# Patient Record
Sex: Female | Born: 1970 | Race: Asian | Hispanic: No | Marital: Single | Smoking: Never smoker
Health system: Southern US, Community
[De-identification: ages and names within clinical notes are randomized; demographics above are authoritative.]

---

## 2013-02-02 HISTORY — PX: ABDOMINOPLASTY: SUR9

## 2016-10-13 ENCOUNTER — Other Ambulatory Visit: Payer: Self-pay | Admitting: Family Medicine

## 2016-10-13 DIAGNOSIS — R5381 Other malaise: Secondary | ICD-10-CM

## 2016-10-22 ENCOUNTER — Other Ambulatory Visit: Payer: Self-pay | Admitting: Family Medicine

## 2016-10-22 DIAGNOSIS — N6489 Other specified disorders of breast: Secondary | ICD-10-CM

## 2016-10-22 DIAGNOSIS — R928 Other abnormal and inconclusive findings on diagnostic imaging of breast: Secondary | ICD-10-CM

## 2016-10-30 ENCOUNTER — Inpatient Hospital Stay: Admission: RE | Admit: 2016-10-30 | Payer: Self-pay | Source: Ambulatory Visit

## 2016-10-30 ENCOUNTER — Other Ambulatory Visit: Payer: Self-pay

## 2016-11-04 ENCOUNTER — Ambulatory Visit
Admission: RE | Admit: 2016-11-04 | Discharge: 2016-11-04 | Disposition: A | Discharge status: Home / Self Care | Payer: BLUE CROSS/BLUE SHIELD | Source: Ambulatory Visit | Attending: Family Medicine | Admitting: Family Medicine

## 2016-11-04 ENCOUNTER — Ambulatory Visit: Payer: Self-pay

## 2016-11-04 DIAGNOSIS — N6489 Other specified disorders of breast: Secondary | ICD-10-CM

## 2016-11-04 DIAGNOSIS — R928 Other abnormal and inconclusive findings on diagnostic imaging of breast: Secondary | ICD-10-CM

## 2016-12-22 ENCOUNTER — Other Ambulatory Visit: Payer: Self-pay | Admitting: Obstetrics and Gynecology

## 2016-12-22 ENCOUNTER — Other Ambulatory Visit (HOSPITAL_COMMUNITY)
Admission: RE | Admit: 2016-12-22 | Discharge: 2016-12-22 | Disposition: A | Discharge status: Home / Self Care | Payer: BLUE CROSS/BLUE SHIELD | Source: Ambulatory Visit | Attending: Obstetrics and Gynecology | Admitting: Obstetrics and Gynecology

## 2016-12-22 DIAGNOSIS — Z124 Encounter for screening for malignant neoplasm of cervix: Secondary | ICD-10-CM | POA: Diagnosis not present

## 2016-12-28 LAB — CYTOLOGY - PAP
Chlamydia: NEGATIVE
Diagnosis: NEGATIVE
HPV: NOT DETECTED
Neisseria Gonorrhea: NEGATIVE

## 2019-05-09 ENCOUNTER — Other Ambulatory Visit: Payer: Self-pay | Admitting: Family Medicine

## 2019-05-09 DIAGNOSIS — Z1231 Encounter for screening mammogram for malignant neoplasm of breast: Secondary | ICD-10-CM

## 2019-05-12 ENCOUNTER — Ambulatory Visit
Admission: RE | Admit: 2019-05-12 | Discharge: 2019-05-12 | Disposition: A | Discharge status: Home / Self Care | Payer: 59 | Source: Ambulatory Visit | Attending: Family Medicine | Admitting: Family Medicine

## 2019-05-12 ENCOUNTER — Other Ambulatory Visit: Payer: Self-pay

## 2019-05-12 DIAGNOSIS — Z1231 Encounter for screening mammogram for malignant neoplasm of breast: Secondary | ICD-10-CM

## 2020-04-19 ENCOUNTER — Other Ambulatory Visit: Payer: Self-pay | Admitting: Family Medicine

## 2020-04-19 DIAGNOSIS — Z1231 Encounter for screening mammogram for malignant neoplasm of breast: Secondary | ICD-10-CM

## 2020-06-12 ENCOUNTER — Ambulatory Visit: Payer: 59

## 2020-08-12 ENCOUNTER — Other Ambulatory Visit: Payer: Self-pay

## 2020-08-12 ENCOUNTER — Ambulatory Visit
Admission: RE | Admit: 2020-08-12 | Discharge: 2020-08-12 | Disposition: A | Discharge status: Home / Self Care | Payer: 59 | Source: Ambulatory Visit | Attending: Family Medicine | Admitting: Family Medicine

## 2020-08-12 DIAGNOSIS — Z1231 Encounter for screening mammogram for malignant neoplasm of breast: Secondary | ICD-10-CM

## 2020-09-23 ENCOUNTER — Telehealth: Payer: Self-pay | Admitting: Physician Assistant

## 2020-09-23 NOTE — Telephone Encounter (Signed)
Received a new hem referralf rom Dr. Chanetta Marshall for IDA. Ms. Salsberry returned my call and has been scheduled to see Karena Addison on 9/6 at 2pm per pt's request.  Pt aware to arrive 20 minutes early.

## 2020-10-08 ENCOUNTER — Inpatient Hospital Stay: Payer: 59

## 2020-10-08 ENCOUNTER — Other Ambulatory Visit: Payer: Self-pay

## 2020-10-08 ENCOUNTER — Inpatient Hospital Stay: Payer: 59 | Attending: Physician Assistant | Admitting: Physician Assistant

## 2020-10-08 ENCOUNTER — Encounter: Payer: Self-pay | Admitting: Physician Assistant

## 2020-10-08 VITALS — BP 116/78 | HR 95 | Temp 99.0°F | Resp 18 | Wt 159.9 lb

## 2020-10-08 DIAGNOSIS — D509 Iron deficiency anemia, unspecified: Secondary | ICD-10-CM | POA: Insufficient documentation

## 2020-10-08 DIAGNOSIS — D508 Other iron deficiency anemias: Secondary | ICD-10-CM

## 2020-10-08 LAB — CBC WITH DIFFERENTIAL (CANCER CENTER ONLY)
Abs Immature Granulocytes: 0.03 10*3/uL (ref 0.00–0.07)
Basophils Absolute: 0 10*3/uL (ref 0.0–0.1)
Basophils Relative: 0 %
Eosinophils Absolute: 0.1 10*3/uL (ref 0.0–0.5)
Eosinophils Relative: 1 %
HCT: 36 % (ref 36.0–46.0)
Hemoglobin: 11.2 g/dL — ABNORMAL LOW (ref 12.0–15.0)
Immature Granulocytes: 0 %
Lymphocytes Relative: 17 %
Lymphs Abs: 1.5 10*3/uL (ref 0.7–4.0)
MCH: 25.1 pg — ABNORMAL LOW (ref 26.0–34.0)
MCHC: 31.1 g/dL (ref 30.0–36.0)
MCV: 80.5 fL (ref 80.0–100.0)
Monocytes Absolute: 0.3 10*3/uL (ref 0.1–1.0)
Monocytes Relative: 4 %
Neutro Abs: 6.9 10*3/uL (ref 1.7–7.7)
Neutrophils Relative %: 78 %
Platelet Count: 310 10*3/uL (ref 150–400)
RBC: 4.47 MIL/uL (ref 3.87–5.11)
RDW: 22.2 % — ABNORMAL HIGH (ref 11.5–15.5)
WBC Count: 8.9 10*3/uL (ref 4.0–10.5)
nRBC: 0 % (ref 0.0–0.2)

## 2020-10-08 LAB — RETIC PANEL
Immature Retic Fract: 7.6 % (ref 2.3–15.9)
RBC.: 4.54 MIL/uL (ref 3.87–5.11)
Retic Count, Absolute: 41.3 10*3/uL (ref 19.0–186.0)
Retic Ct Pct: 0.9 % (ref 0.4–3.1)
Reticulocyte Hemoglobin: 27.3 pg — ABNORMAL LOW (ref >27.9)

## 2020-10-08 LAB — CMP (CANCER CENTER ONLY)
ALT: 7 U/L (ref 0–44)
AST: 11 U/L — ABNORMAL LOW (ref 15–41)
Albumin: 3.7 g/dL (ref 3.5–5.0)
Alkaline Phosphatase: 53 U/L (ref 38–126)
Anion gap: 10 (ref 5–15)
BUN: 10 mg/dL (ref 6–20)
CO2: 26 mmol/L (ref 22–32)
Calcium: 9.2 mg/dL (ref 8.9–10.3)
Chloride: 104 mmol/L (ref 98–111)
Creatinine: 0.85 mg/dL (ref 0.44–1.00)
GFR, Estimated: >60 mL/min (ref >60)
Glucose, Bld: 175 mg/dL — ABNORMAL HIGH (ref 70–99)
Potassium: 4 mmol/L (ref 3.5–5.1)
Sodium: 140 mmol/L (ref 135–145)
Total Bilirubin: 0.3 mg/dL (ref 0.3–1.2)
Total Protein: 7.9 g/dL (ref 6.5–8.1)

## 2020-10-08 LAB — IRON AND TIBC
Iron: 31 ug/dL — ABNORMAL LOW (ref 41–142)
Saturation Ratios: 8 % — ABNORMAL LOW (ref 21–57)
TIBC: 396 ug/dL (ref 236–444)
UIBC: 365 ug/dL (ref 120–384)

## 2020-10-08 LAB — FERRITIN: Ferritin: 8 ng/mL — ABNORMAL LOW (ref 11–307)

## 2020-10-08 MED ORDER — FERROUS SULFATE 325 (65 FE) MG PO TBEC: 325.0000 mg | DELAYED_RELEASE_TABLET | Freq: Every day | ORAL | 3 refills | Status: DC

## 2020-10-08 NOTE — Progress Notes (Signed)
Ref GI

## 2020-10-08 NOTE — Progress Notes (Signed)
[facility] [phone]   [phone]  INITIAL CONSULT NOTE  Patient Care Team: Shon Hale, MD as PCP - General (Family Medicine)  Hematological/Oncological History 1) Labs from PCP, Dr. Garth Bigness, at [facility].  -08/07/2020: WBC 6.2, Hgb 8.0 (L), MCV 64.8 (L), Plt 362 -08/16/2020: TIBC 459 (H), Iron 13 (L), Iron saturation 3% (L), Transferrin 328 -09/13/2020: WBC 5.1, Hgb 9.4 (L), MCV 72.5 (L), Plt 363, TIBC 42, Iron 35 (L), Iron saturation 8% (L), Transferrin 303.   2) 10/08/2020: Establish care with Danielle Kaufmann Danielle Howell  CHIEF COMPLAINTS/PURPOSE OF CONSULTATION:  Iron deficiency anemia  HISTORY OF PRESENTING ILLNESS:  Danielle Howell 50 y.o. female without any significant medical conditions presents to the clinic for evaluation for iron deficiency anemia. Patient is unaccompanied for this visit.   On exam today, Danielle Howell reports mild fatigue mainly during her menstrual cycle. Otherwise, she denies lack of energy and is able to complete her ADLs on her own. Sh has a good appetite and denies any dietary restrictions. She denies any nausea, vomiting or abdominal pain. Her bowel movements are regular without any diarrhea or constipation. She reports that her menstrual cycles are fairly regular occurring every 28 days. She reports that her periods last 3 days and the second day is usually the heaviest. She goes through 3-4 pads/tampons per day. She has no other signs of bleeding including hematochezia or melena. Patient denies any fevers, chills, night sweats, shortness of breath, chest pain, cough, headaches, dizziness. She has no other complaints. Rest of the 10 point ROS is below.   MEDICAL HISTORY:  History reviewed. No pertinent past medical history.  SURGICAL HISTORY: Past Surgical History:  Procedure Laterality Date   ABDOMINOPLASTY  2015    SOCIAL HISTORY: Social History   Socioeconomic History   Marital status: Single     Spouse name: Not on file   Number of children: Not on file   Years of education: Not on file   Highest education level: Not on file  Occupational History   Not on file  Tobacco Use   Smoking status: Never   Smokeless tobacco: Never  Substance and Sexual Activity   Alcohol use: Yes    Comment: occasionally   Drug use: Never   Sexual activity: Not on file  Other Topics Concern   Not on file  Social History Narrative   Not on file   Social Determinants of Health   Financial Resource Strain: Not on file  Food Insecurity: Not on file  Transportation Needs: Not on file  Physical Activity: Not on file  Stress: Not on file  Social Connections: Not on file  Intimate Partner Violence: Not on file    FAMILY HISTORY: History reviewed. No pertinent family history.  ALLERGIES:  has no allergies on file.  MEDICATIONS:  Current Outpatient Medications  Medication Sig Dispense Refill   ferrous sulfate 325 (65 FE) MG EC tablet Take 1 tablet (325 mg total) by mouth daily with breakfast. 30 tablet 3   No current facility-administered medications for this visit.    REVIEW OF SYSTEMS:   Constitutional: ( - ) fevers, ( - )  chills , ( - ) night sweats Eyes: ( - ) blurriness of vision, ( - ) double vision, ( - ) watery eyes Ears, nose, mouth, throat, and face: ( - ) mucositis, ( - ) sore throat Respiratory: ( - ) cough, ( - ) dyspnea, ( - ) wheezes Cardiovascular: ( - )  palpitation, ( - ) chest discomfort, ( - ) lower extremity swelling Gastrointestinal:  ( - ) nausea, ( - ) heartburn, ( - ) change in bowel habits Skin: ( - ) abnormal skin rashes Lymphatics: ( - ) new lymphadenopathy, ( - ) easy bruising Neurological: ( - ) numbness, ( - ) tingling, ( - ) new weaknesses Behavioral/Psych: ( - ) mood change, ( - ) new changes  All other systems were reviewed with the patient and are negative.  PHYSICAL EXAMINATION: ECOG PERFORMANCE STATUS: 0 - Asymptomatic  Vitals:   10/08/20 1400   BP: 116/78  Pulse: 95  Resp: 18  Temp: 99 F (37.2 C)  SpO2: 100%   Filed Weights   10/08/20 1400  Weight: 159 lb 14.4 oz (72.5 kg)    GENERAL: well appearing female in NAD  SKIN: skin color, texture, turgor are normal, no rashes or significant lesions EYES: conjunctiva are pink and non-injected, sclera clear OROPHARYNX: no exudate, no erythema; lips, buccal mucosa, and tongue normal  NECK: supple, non-tender LYMPH:  no palpable lymphadenopathy in the cervical, axillary or supraclavicular lymph nodes.  LUNGS: clear to auscultation and percussion with normal breathing effort HEART: regular rate & rhythm and no murmurs and no lower extremity edema ABDOMEN: soft, non-tender, non-distended, normal bowel sounds Musculoskeletal: no cyanosis of digits and no clubbing  PSYCH: alert & oriented x 3, fluent speech NEURO: no focal motor/sensory deficits  LABORATORY DATA:  I have reviewed the data as listed CBC Latest Ref Rng & Units 10/08/2020  WBC 4.0 - 10.5 K/uL 8.9  Hemoglobin 12.0 - 15.0 g/dL 11.2(L)  Hematocrit 36.0 - 46.0 % 36.0  Platelets 150 - 400 K/uL 310    CMP Latest Ref Rng & Units 10/08/2020  Glucose 70 - 99 mg/dL 413(K)  BUN 6 - 20 mg/dL 10  Creatinine 4.40 - 1.02 mg/dL 7.25  Sodium 366 - 440 mmol/L 140  Potassium 3.5 - 5.1 mmol/L 4.0  Chloride 98 - 111 mmol/L 104  CO2 22 - 32 mmol/L 26  Calcium 8.9 - 10.3 mg/dL 9.2  Total Protein 6.5 - 8.1 g/dL 7.9  Total Bilirubin 0.3 - 1.2 mg/dL 0.3  Alkaline Phos 38 - 126 U/L 53  AST 15 - 41 U/L 11(L)  ALT 0 - 44 U/L 7     ASSESSMENT & PLAN Danielle Howell is a 50 y.o. female who presents to the clinic for iron deficiency anemia. Patient does have monthly menstrual cycle but not very heavy so we recommend a referral to GI to rule out GI bleed vs malabsorption. Patient is currently taking iron gummies with minimal improvement of iron levels. I recommend to start ferrous sulfate 325 mg once daily with a source of vitamin C. Patient  will proceed with labs today to repeat CBC, CMP, iron and TIBC, ferritin and retic panel. If patient's anemia has not improved, we will proceed with IV iron infusions.   #Iron deficiency anemia: --Currently on iron gummies so recommend to switch to ferrous sulfate 325 mg once daily. Advised to take with a source of vitamin C --Differentials include menstrual bleeding, GI bleeding and malabsorption. Placed referral to gastroenterology.  --Labs today to check CBC, CMP, iron and TIBC, retic panel and ferritin levels.  --If anemia has not improved, recommend IV iron infusions --RTC in 3 months with labs.  Orders Placed This Encounter  Procedures   CBC with Differential (Cancer Center Only)    Standing Status:   Future    Number  of Occurrences:   1    Standing Expiration Date:   10/08/2021   CMP (Cancer Center only)    Standing Status:   Future    Number of Occurrences:   1    Standing Expiration Date:   10/08/2021   Ferritin    Standing Status:   Future    Number of Occurrences:   1    Standing Expiration Date:   10/08/2021   Iron and TIBC    Standing Status:   Future    Number of Occurrences:   1    Standing Expiration Date:   10/08/2021   Retic Panel    Standing Status:   Future    Number of Occurrences:   1    Standing Expiration Date:   10/08/2021   Ambulatory referral to Gastroenterology    Referral Priority:   Routine    Referral Type:   Consultation    Referral Reason:   Specialty Services Required    Number of Visits Requested:   1    All questions were answered. The patient knows to call the clinic with any problems, questions or concerns.  I have spent a total of 60 minutes minutes of face-to-face and non-face-to-face time, preparing to see the patient, obtaining and/or reviewing separately obtained history, performing a medically appropriate examination, counseling and educating the patient, ordering medications/tests, referring with other health care professionals, documenting  clinical information in the electronic health record,  and care coordination.   Danielle Kaufmann, Danielle Howell Department of Hematology/Oncology [facility] at [facility] Phone: [phone]  Patient was seen with Dr. Leonides Schanz.   I have read the above note and personally examined the patient. I agree with the assessment and plan as noted above.  Briefly Danielle Howell is a 50 year old female with medical history significant for iron deficiency anemia secondary to GYN bleeding.  At this time her hemoglobin is mildly low at 11.2 therefore I think we can replete her iron levels with p.o. supplementation.  Recommend prescribing ferrous sulfate 325 mg daily with a source of vitamin C.  We will plan to see the patient back in 3 months time in order to assure that this is bolstering her iron levels.   Danielle Barns, MD Department of Hematology/Oncology [facility] at [facility] Phone: [phone] Pager: [phone] Email: [email or web address]

## 2020-10-22 ENCOUNTER — Telehealth: Payer: Self-pay | Admitting: Hematology and Oncology

## 2020-10-22 NOTE — Telephone Encounter (Signed)
Sch per 9/7 los, pt aware.

## 2021-01-09 ENCOUNTER — Other Ambulatory Visit: Payer: Self-pay | Admitting: Hematology and Oncology

## 2021-01-09 ENCOUNTER — Inpatient Hospital Stay: Payer: 59 | Attending: Physician Assistant

## 2021-01-09 ENCOUNTER — Inpatient Hospital Stay (HOSPITAL_BASED_OUTPATIENT_CLINIC_OR_DEPARTMENT_OTHER): Payer: 59 | Admitting: Hematology and Oncology

## 2021-01-09 ENCOUNTER — Other Ambulatory Visit: Payer: Self-pay

## 2021-01-09 VITALS — BP 108/75 | HR 79 | Temp 97.3°F | Resp 18 | Wt 163.2 lb

## 2021-01-09 DIAGNOSIS — D508 Other iron deficiency anemias: Secondary | ICD-10-CM

## 2021-01-09 DIAGNOSIS — D509 Iron deficiency anemia, unspecified: Secondary | ICD-10-CM | POA: Insufficient documentation

## 2021-01-09 LAB — CMP (CANCER CENTER ONLY)
ALT: 8 U/L (ref 0–44)
AST: 11 U/L — ABNORMAL LOW (ref 15–41)
Albumin: 3.6 g/dL (ref 3.5–5.0)
Alkaline Phosphatase: 55 U/L (ref 38–126)
Anion gap: 8 (ref 5–15)
BUN: 11 mg/dL (ref 6–20)
CO2: 24 mmol/L (ref 22–32)
Calcium: 8.3 mg/dL — ABNORMAL LOW (ref 8.9–10.3)
Chloride: 108 mmol/L (ref 98–111)
Creatinine: 0.76 mg/dL (ref 0.44–1.00)
GFR, Estimated: >60 mL/min (ref >60)
Glucose, Bld: 207 mg/dL — ABNORMAL HIGH (ref 70–99)
Potassium: 4 mmol/L (ref 3.5–5.1)
Sodium: 140 mmol/L (ref 135–145)
Total Bilirubin: 0.4 mg/dL (ref 0.3–1.2)
Total Protein: 7.4 g/dL (ref 6.5–8.1)

## 2021-01-09 LAB — CBC WITH DIFFERENTIAL (CANCER CENTER ONLY)
Abs Immature Granulocytes: 0.01 10*3/uL (ref 0.00–0.07)
Basophils Absolute: 0 10*3/uL (ref 0.0–0.1)
Basophils Relative: 1 %
Eosinophils Absolute: 0.1 10*3/uL (ref 0.0–0.5)
Eosinophils Relative: 2 %
HCT: 35.4 % — ABNORMAL LOW (ref 36.0–46.0)
Hemoglobin: 11.2 g/dL — ABNORMAL LOW (ref 12.0–15.0)
Immature Granulocytes: 0 %
Lymphocytes Relative: 35 %
Lymphs Abs: 1.9 10*3/uL (ref 0.7–4.0)
MCH: 26.9 pg (ref 26.0–34.0)
MCHC: 31.6 g/dL (ref 30.0–36.0)
MCV: 85.1 fL (ref 80.0–100.0)
Monocytes Absolute: 0.2 10*3/uL (ref 0.1–1.0)
Monocytes Relative: 3 %
Neutro Abs: 3.3 10*3/uL (ref 1.7–7.7)
Neutrophils Relative %: 59 %
Platelet Count: 307 10*3/uL (ref 150–400)
RBC: 4.16 MIL/uL (ref 3.87–5.11)
RDW: 13.8 % (ref 11.5–15.5)
WBC Count: 5.5 10*3/uL (ref 4.0–10.5)
nRBC: 0 % (ref 0.0–0.2)

## 2021-01-09 LAB — RETIC PANEL
Immature Retic Fract: 13.4 % (ref 2.3–15.9)
RBC.: 4.04 MIL/uL (ref 3.87–5.11)
Retic Count, Absolute: 78 10*3/uL (ref 19.0–186.0)
Retic Ct Pct: 1.9 % (ref 0.4–3.1)
Reticulocyte Hemoglobin: 30.4 pg (ref >27.9)

## 2021-01-09 LAB — FERRITIN: Ferritin: 19 ng/mL (ref 11–307)

## 2021-01-09 LAB — IRON AND TIBC
Iron: 44 ug/dL (ref 41–142)
Saturation Ratios: 12 % — ABNORMAL LOW (ref 21–57)
TIBC: 356 ug/dL (ref 236–444)
UIBC: 312 ug/dL (ref 120–384)

## 2021-01-09 NOTE — Progress Notes (Signed)
[facility] Telephone:([phone]   Fax:(336) [phone]  PROGRESS NOTE  Patient Care Team: Shon Hale, MD as PCP - General (Family Medicine)  Hematological/Oncological History # Iron Deficiency Anemia, Unclear Etiology  1) Labs from PCP, Dr. Garth Bigness, at [facility].  -08/07/2020: WBC 6.2, Hgb 8.0 (L), MCV 64.8 (L), Plt 362 -08/16/2020: TIBC 459 (H), Iron 13 (L), Iron saturation 3% (L), Transferrin 328 -09/13/2020: WBC 5.1, Hgb 9.4 (L), MCV 72.5 (L), Plt 363, TIBC 42, Iron 35 (L), Iron saturation 8% (L), Transferrin 303.    2) 10/08/2020: Establish care with Georga Kaufmann PA-C  Interval History:  Danielle Howell 50 y.o. female with medical history significant for iron deficiency anemia of unclear etiology who presents for a follow up visit. The patient's last visit was on 10/08/2020 at which time she established care with Georga Kaufmann. In the interim since the last visit she is continued on p.o. ferrous sulfate 325 mg daily  On exam today Danielle Howell reports that she is been tolerating the iron pills well.  She notes that she "sometimes forgets" to take the medication.  She takes it at least every other day.  She notes that she has noticed a difference while taking these and she feels like her energy levels are better.  She denies any stomach upset, constipation, or diarrhea while taking this medication.  She notes that she continues to have relatively heavy menstrual cycles.  She is connected to an OB/GYN doctor.  She does not see any overt signs of GI bleeding.  She was contacted by GI but has not yet called them back.  On discussion of the options moving forward the patient noted that she wanted to continue iron pills and does not wish to pursue IV iron therapy at this time.  Given the relative stability of her hemoglobin I do believe that this is reasonable.  She currently denies any fevers, chills, sweats, nausea, ming or diarrhea.  She denies any ice cravings or  fatigue/shortness of breath.  A full 10 point ROS is listed below.  MEDICAL HISTORY:  No past medical history on file.  SURGICAL HISTORY: Past Surgical History:  Procedure Laterality Date   ABDOMINOPLASTY  2015    SOCIAL HISTORY: Social History   Socioeconomic History   Marital status: Single    Spouse name: Not on file   Number of children: Not on file   Years of education: Not on file   Highest education level: Not on file  Occupational History   Not on file  Tobacco Use   Smoking status: Never   Smokeless tobacco: Never  Substance and Sexual Activity   Alcohol use: Yes    Comment: occasionally   Drug use: Never   Sexual activity: Not on file  Other Topics Concern   Not on file  Social History Narrative   Not on file   Social Determinants of Health   Financial Resource Strain: Not on file  Food Insecurity: Not on file  Transportation Needs: Not on file  Physical Activity: Not on file  Stress: Not on file  Social Connections: Not on file  Intimate Partner Violence: Not on file    FAMILY HISTORY: No family history on file.  ALLERGIES:  has no allergies on file.  MEDICATIONS:  Current Outpatient Medications  Medication Sig Dispense Refill   ferrous sulfate 325 (65 FE) MG EC tablet Take 1 tablet (325 mg total) by mouth daily with breakfast. 30 tablet 3  No current facility-administered medications for this visit.    REVIEW OF SYSTEMS:   Constitutional: ( - ) fevers, ( - )  chills , ( - ) night sweats Eyes: ( - ) blurriness of vision, ( - ) double vision, ( - ) watery eyes Ears, nose, mouth, throat, and face: ( - ) mucositis, ( - ) sore throat Respiratory: ( - ) cough, ( - ) dyspnea, ( - ) wheezes Cardiovascular: ( - ) palpitation, ( - ) chest discomfort, ( - ) lower extremity swelling Gastrointestinal:  ( - ) nausea, ( - ) heartburn, ( - ) change in bowel habits Skin: ( - ) abnormal skin rashes Lymphatics: ( - ) new lymphadenopathy, ( - ) easy  bruising Neurological: ( - ) numbness, ( - ) tingling, ( - ) new weaknesses Behavioral/Psych: ( - ) mood change, ( - ) new changes  All other systems were reviewed with the patient and are negative.  PHYSICAL EXAMINATION:  Vitals:   01/09/21 1044  BP: 108/75  Pulse: 79  Resp: 18  Temp: (!) 97.3 F (36.3 C)  SpO2: 98%   Filed Weights   01/09/21 1044  Weight: 163 lb 3 oz (74 kg)    GENERAL: Well-appearing middle-aged Hispanic female, alert, no distress and comfortable SKIN: skin color, texture, turgor are normal, no rashes or significant lesions LUNGS: clear to auscultation and percussion with normal breathing effort HEART: regular rate & rhythm and no murmurs and no lower extremity edema Musculoskeletal: no cyanosis of digits and no clubbing  PSYCH: alert & oriented x 3, fluent speech NEURO: no focal motor/sensory deficits  LABORATORY DATA:  I have reviewed the data as listed CBC Latest Ref Rng & Units 01/09/2021 10/08/2020  WBC 4.0 - 10.5 K/uL 5.5 8.9  Hemoglobin 12.0 - 15.0 g/dL 11.2(L) 11.2(L)  Hematocrit 36.0 - 46.0 % 35.4(L) 36.0  Platelets 150 - 400 K/uL 307 310    CMP Latest Ref Rng & Units 01/09/2021 10/08/2020  Glucose 70 - 99 mg/dL 207(H) 175(H)  BUN 6 - 20 mg/dL 11 10  Creatinine 0.44 - 1.00 mg/dL 0.76 0.85  Sodium 135 - 145 mmol/L 140 140  Potassium 3.5 - 5.1 mmol/L 4.0 4.0  Chloride 98 - 111 mmol/L 108 104  CO2 22 - 32 mmol/L 24 26  Calcium 8.9 - 10.3 mg/dL 8.3(L) 9.2  Total Protein 6.5 - 8.1 g/dL 7.4 7.9  Total Bilirubin 0.3 - 1.2 mg/dL 0.4 0.3  Alkaline Phos 38 - 126 U/L 55 53  AST 15 - 41 U/L 11(L) 11(L)  ALT 0 - 44 U/L 8 7    No results found for: MPROTEIN No results found for: KPAFRELGTCHN, LAMBDASER, KAPLAMBRATIO  RADIOGRAPHIC STUDIES: No results found.  ASSESSMENT & PLAN Danielle Howell 50 y.o. female with medical history significant for iron deficiency anemia of unclear etiology who presents for a follow up visit.  Possible etiologies for this  patient's iron deficiency anemia include menstrual bleeding, GI bleeding, or possible malabsorption.  Her hemoglobin is relatively stable at 11.2, unchanged from her prior visit.  Patient notes that she would not like to pursue IV iron therapy and therefore we can continue p.o. therapy at this time with a repeat visit in 3 months in order to assure her hemoglobin levels are stable/improving.  # Iron Deficiency Anemia, Unclear Etiology  --Continue ferrous sulfate 325 mg once daily. Advised to take with a source of vitamin C --Differentials include menstrual bleeding, GI bleeding and malabsorption. -- Patient  reports that gastroenterology is reach out to her but she has not called them back.  Encouraged her to reconnect with gastroenterology for consideration of colonoscopy. --Labs today to check CBC, CMP, iron and TIBC, retic panel and ferritin levels.  -- Anemia has remained stable.  Iron levels currently pending.  Patient notes that she would like to continue p.o. iron therapy rather than pursuing IV iron. --RTC in 3 months with labs.  No orders of the defined types were placed in this encounter.   All questions were answered. The patient knows to call the clinic with any problems, questions or concerns.  A total of more than 30 minutes were spent on this encounter with face-to-face time and non-face-to-face time, including preparing to see the patient, ordering tests and/or medications, counseling the patient and coordination of care as outlined above.   Ledell Peoples, MD Department of Hematology/Oncology [place] at [facility] Phone: [phone] Pager: [phone] Email: [email or web address]  01/09/2021 1:07 PM

## 2021-01-10 ENCOUNTER — Telehealth: Payer: Self-pay | Admitting: *Deleted

## 2021-01-10 NOTE — Telephone Encounter (Signed)
TCT patient regarding recent lab results. No answer but was able to leave vm message for pt to call this office @ [phone] to review labs and provide Dr. Derek Mound recommendations.

## 2021-01-10 NOTE — Telephone Encounter (Signed)
-----   Message from Jaci Standard, MD sent at 01/10/2021  8:54 AM EST ----- Please let Mrs. Corsello know that her iron labs are slowly improving. She noted last time she would prefer to continue PO iron therapy. If she would like to consider IV iron to help bolster her levels we would be happy to set that up. We will see her back in 3 months time.   ----- Message ----- From: Leory Plowman, Lab In [place] Sent: 01/09/2021  10:07 AM EST To: Jaci Standard, MD

## 2021-01-24 ENCOUNTER — Telehealth: Payer: Self-pay

## 2021-01-24 NOTE — Telephone Encounter (Signed)
-----   Message from Kyra Searles, RN sent at 01/24/2021 12:10 PM EST -----  ----- Message ----- From: Jaci Standard, MD Sent: 01/10/2021   8:55 AM EST To: Kyra Searles, RN  Please let Mrs. Yonke know that her iron labs are slowly improving. She noted last time she would prefer to continue PO iron therapy. If she would like to consider IV iron to help bolster her levels we would be happy to set that up. We will see her back in 3 months time.   ----- Message ----- From: Leory Plowman, Lab In [place] Sent: 01/09/2021  10:07 AM EST To: Jaci Standard, MD

## 2021-01-24 NOTE — Telephone Encounter (Signed)
Pt advised ans she voiced understanding.  She still wants to think about the IV iron and will decide at next appt

## 2021-03-22 IMAGING — MG DIGITAL SCREENING BILAT W/ TOMO W/ CAD
8 series · 8 of 24 positions shown · non-contrast
Comparison: Previous exam(s).

CLINICAL DATA: Screening.

EXAM:
DIGITAL SCREENING BILATERAL MAMMOGRAM WITH TOMO AND CAD

[L CC synth-2D]
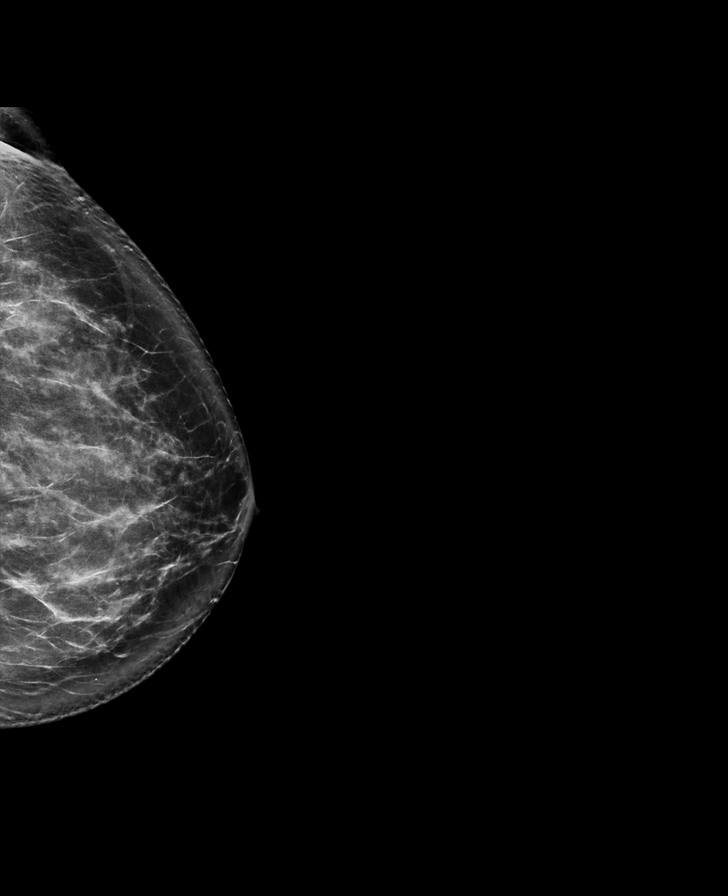

[R MLO synth-2D]
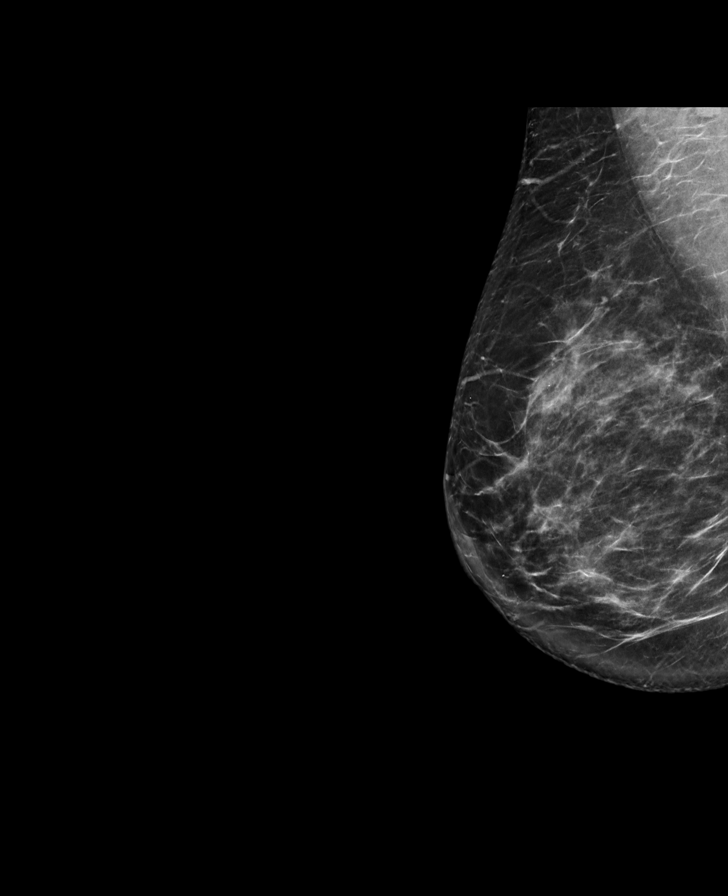

[L MLO synth-2D]
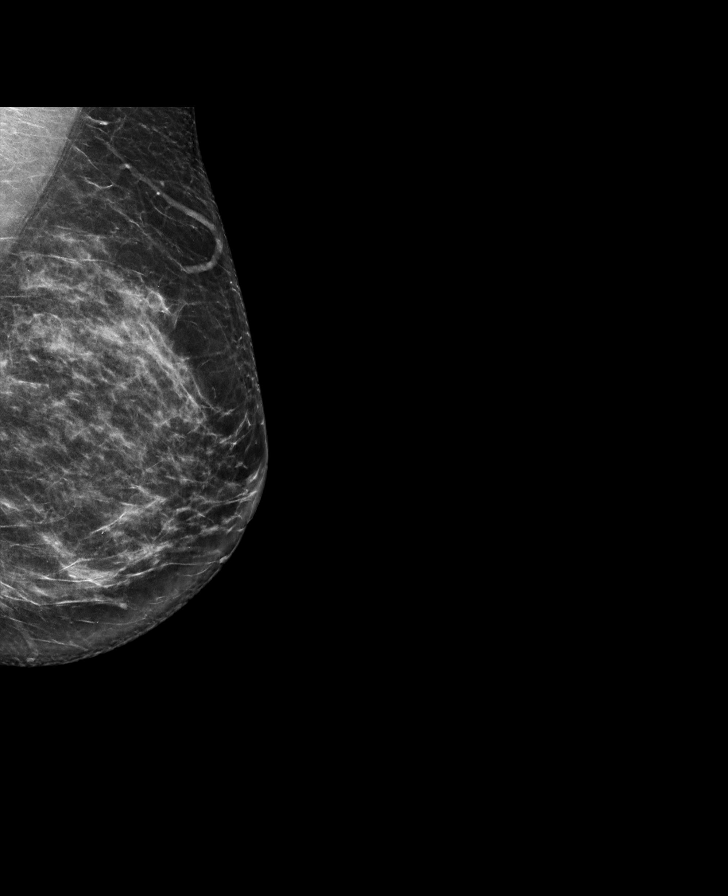

[R CC synth-2D]
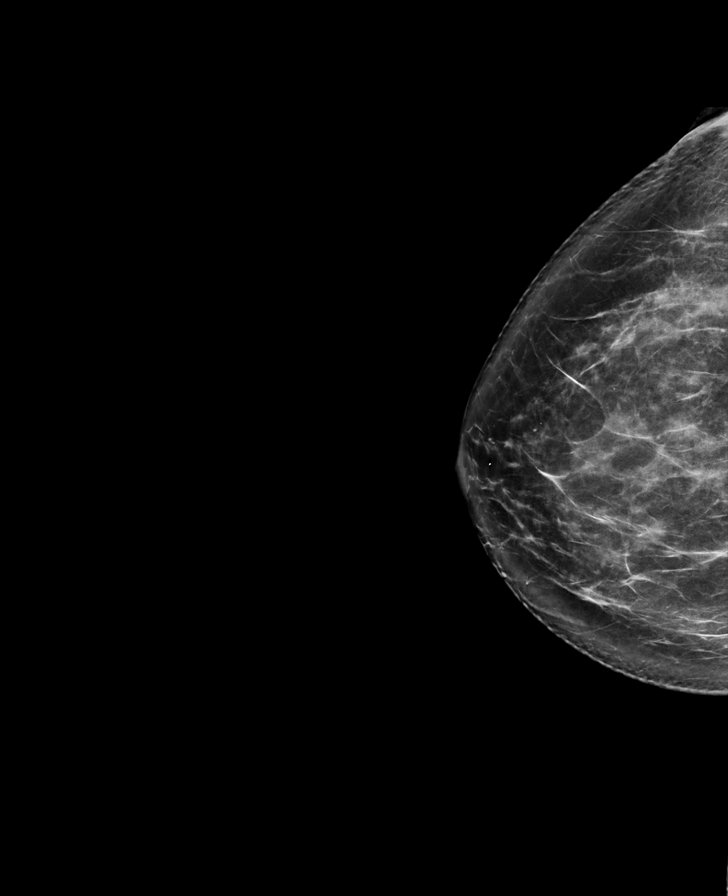

[L MLO tomo · tomo slice 39/76.0]
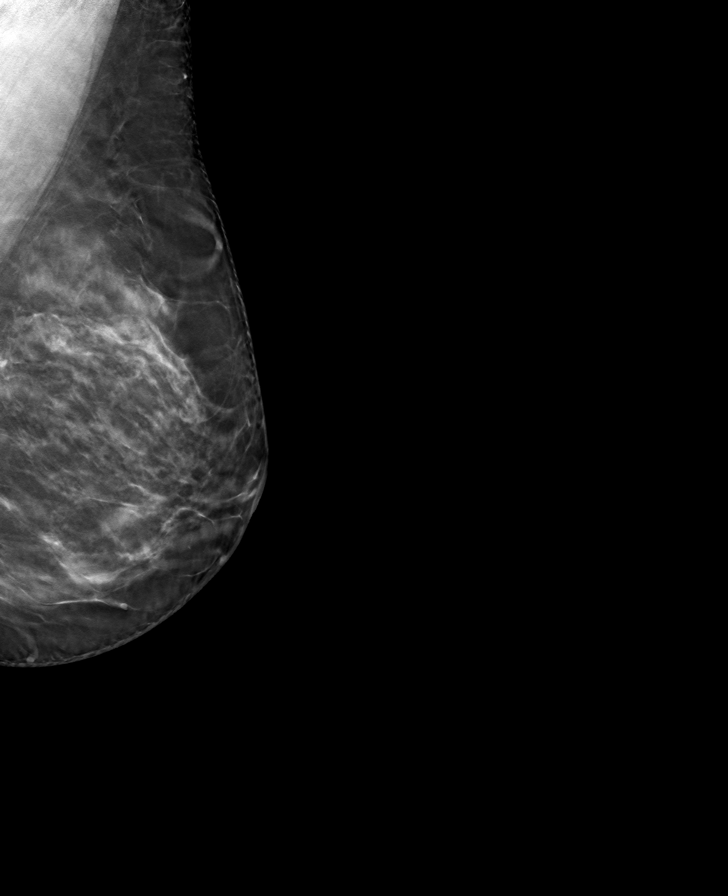

[R MLO tomo · tomo slice 39/78.0]
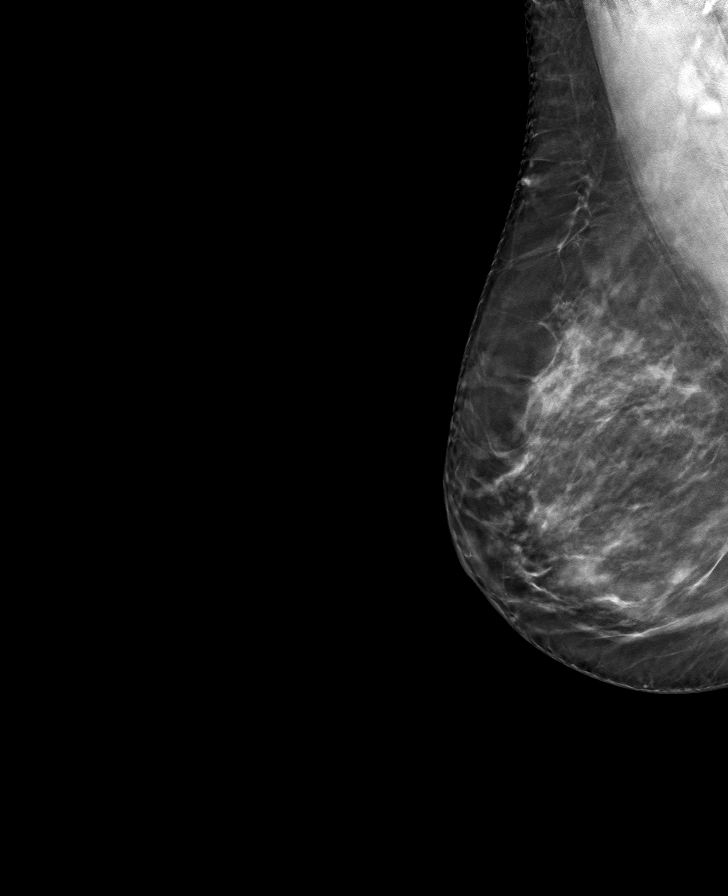

[R CC tomo · tomo slice 41/80.0]
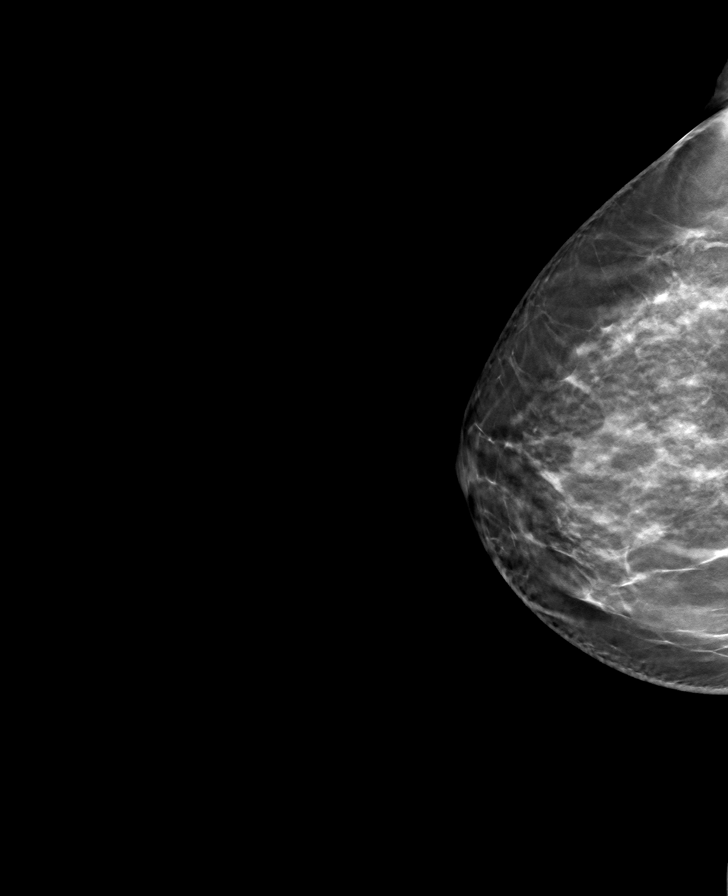

[L CC tomo · tomo slice 41/81.0]
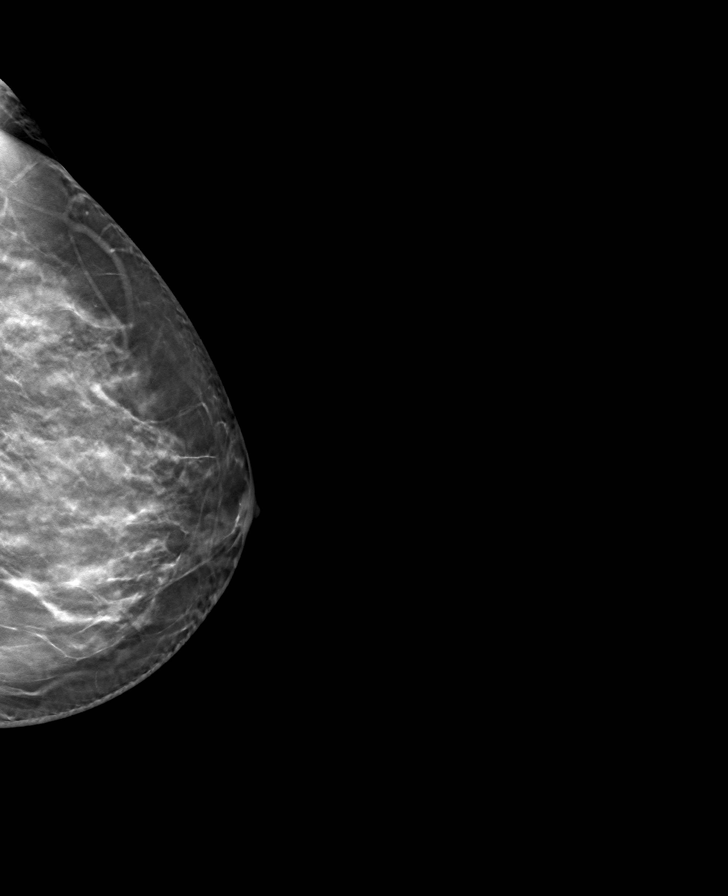

[8 of 24 positions shown; findings below may reference images not displayed]

ACR Breast Density Category c: The breast tissue is heterogeneously
dense, which may obscure small masses.
FINDINGS: There are no findings suspicious for malignancy. Images were
processed with CAD.
IMPRESSION: No mammographic evidence of malignancy. A result letter of this
screening mammogram will be mailed directly to the patient.

RECOMMENDATION:
Screening mammogram in one year. (Code:FT-U-LHB)

BI-RADS CATEGORY  1: Negative.

## 2021-04-09 ENCOUNTER — Other Ambulatory Visit: Payer: Self-pay | Admitting: Hematology and Oncology

## 2021-04-09 ENCOUNTER — Inpatient Hospital Stay (HOSPITAL_BASED_OUTPATIENT_CLINIC_OR_DEPARTMENT_OTHER): Payer: 59 | Admitting: Hematology and Oncology

## 2021-04-09 ENCOUNTER — Inpatient Hospital Stay: Payer: 59 | Attending: Physician Assistant

## 2021-04-09 ENCOUNTER — Other Ambulatory Visit: Payer: Self-pay

## 2021-04-09 VITALS — BP 103/72 | HR 79 | Temp 97.7°F | Resp 17 | Wt 161.2 lb

## 2021-04-09 DIAGNOSIS — D508 Other iron deficiency anemias: Secondary | ICD-10-CM | POA: Diagnosis not present

## 2021-04-09 DIAGNOSIS — D509 Iron deficiency anemia, unspecified: Secondary | ICD-10-CM | POA: Diagnosis not present

## 2021-04-09 LAB — CBC WITH DIFFERENTIAL (CANCER CENTER ONLY)
Abs Immature Granulocytes: 0 10*3/uL (ref 0.00–0.07)
Basophils Absolute: 0 10*3/uL (ref 0.0–0.1)
Basophils Relative: 1 %
Eosinophils Absolute: 0.1 10*3/uL (ref 0.0–0.5)
Eosinophils Relative: 2 %
HCT: 38.7 % (ref 36.0–46.0)
Hemoglobin: 12.8 g/dL (ref 12.0–15.0)
Immature Granulocytes: 0 %
Lymphocytes Relative: 37 %
Lymphs Abs: 2.3 10*3/uL (ref 0.7–4.0)
MCH: 28.4 pg (ref 26.0–34.0)
MCHC: 33.1 g/dL (ref 30.0–36.0)
MCV: 85.8 fL (ref 80.0–100.0)
Monocytes Absolute: 0.3 10*3/uL (ref 0.1–1.0)
Monocytes Relative: 4 %
Neutro Abs: 3.5 10*3/uL (ref 1.7–7.7)
Neutrophils Relative %: 56 %
Platelet Count: 281 10*3/uL (ref 150–400)
RBC: 4.51 MIL/uL (ref 3.87–5.11)
RDW: 13.9 % (ref 11.5–15.5)
WBC Count: 6.3 10*3/uL (ref 4.0–10.5)
nRBC: 0 % (ref 0.0–0.2)

## 2021-04-09 LAB — RETIC PANEL
Immature Retic Fract: 6.7 % (ref 2.3–15.9)
RBC.: 4.45 MIL/uL (ref 3.87–5.11)
Retic Count, Absolute: 76.1 10*3/uL (ref 19.0–186.0)
Retic Ct Pct: 1.7 % (ref 0.4–3.1)
Reticulocyte Hemoglobin: 32.8 pg (ref >27.9)

## 2021-04-09 LAB — FERRITIN: Ferritin: 49 ng/mL (ref 11–307)

## 2021-04-09 LAB — IRON AND IRON BINDING CAPACITY (CC-WL,HP ONLY)
Iron: 50 ug/dL (ref 28–170)
Saturation Ratios: 13 % (ref 10.4–31.8)
TIBC: 371 ug/dL (ref 250–450)
UIBC: 321 ug/dL

## 2021-04-09 LAB — CMP (CANCER CENTER ONLY)
ALT: 12 U/L (ref 0–44)
AST: 18 U/L (ref 15–41)
Albumin: 4.2 g/dL (ref 3.5–5.0)
Alkaline Phosphatase: 46 U/L (ref 38–126)
Anion gap: 7 (ref 5–15)
BUN: 12 mg/dL (ref 6–20)
CO2: 30 mmol/L (ref 22–32)
Calcium: 9.1 mg/dL (ref 8.9–10.3)
Chloride: 102 mmol/L (ref 98–111)
Creatinine: 0.64 mg/dL (ref 0.44–1.00)
GFR, Estimated: >60 mL/min (ref >60)
Glucose, Bld: 143 mg/dL — ABNORMAL HIGH (ref 70–99)
Potassium: 3.8 mmol/L (ref 3.5–5.1)
Sodium: 139 mmol/L (ref 135–145)
Total Bilirubin: 0.3 mg/dL (ref 0.3–1.2)
Total Protein: 7.8 g/dL (ref 6.5–8.1)

## 2021-04-09 MED ORDER — FERROUS SULFATE 325 (65 FE) MG PO TBEC: 325.0000 mg | DELAYED_RELEASE_TABLET | Freq: Every day | ORAL | 2 refills | Status: DC

## 2021-04-09 NOTE — Progress Notes (Signed)
?[facility] ?Telephone:(336) [phone]   [phone] ? ?PROGRESS NOTE ? ?Patient Care Team: ?Shon Hale, MD as PCP - General (Family Medicine) ? ?Hematological/Oncological History ?# Iron Deficiency Anemia, Unclear Etiology  ?1) Labs from PCP, Dr. Garth Bigness, at [facility].  ?-08/07/2020: WBC 6.2, Hgb 8.0 (L), MCV 64.8 (L), Plt 362 ?-08/16/2020: TIBC 459 (H), Iron 13 (L), Iron saturation 3% (L), Transferrin 328 ?-09/13/2020: WBC 5.1, Hgb 9.4 (L), MCV 72.5 (L), Plt 363, TIBC 42, Iron 35 (L), Iron saturation 8% (L), Transferrin 303.  ?  ?2) 10/08/2020: Establish care with Georga Kaufmann PA-C ? ?04/09/2021: Labs show white blood cell count 6.3, hemoglobin 12.8, MCV 85.8, and platelets of 281. ? ?Interval History:  ?Danielle Howell 51 y.o. female with medical history significant for iron deficiency anemia of unclear etiology who presents for a follow up visit. The patient's last visit was on 01/09/2021. In the interim since the last visit she is continued on p.o. ferrous sulfate 325 mg daily ? ?On exam today Danielle Howell reports he has been well in the interim since her last visit.  She notes she takes iron pills almost every day with a rare miss.  She reports that it does not cause any stomach upset or constipation.  She notes that she may be more forgetful than usual.  She denies having any dark stools as result of her iron pills.  She notes she typically takes the pills with either juice or water in the morning.  She reports that her energy levels are "fair".  Her appetite has been good and she denies any overt signs of bleeding, bruising, or dark stools.  She is set up for colonoscopy with [facility] to be performed on 04/30/2021. ? ?  She currently denies any fevers, chills, sweats, nausea, ming or diarrhea.  She denies any ice cravings or fatigue/shortness of breath.  A full 10 point ROS is listed below. ? ?MEDICAL HISTORY:  ?No past medical history on file. ? ?SURGICAL HISTORY: ?Past Surgical  History:  ?Procedure Laterality Date  ? ABDOMINOPLASTY  2015  ? ? ?SOCIAL HISTORY: ?Social History  ? ?Socioeconomic History  ? Marital status: Single  ?  Spouse name: Not on file  ? Number of children: Not on file  ? Years of education: Not on file  ? Highest education level: Not on file  ?Occupational History  ? Not on file  ?Tobacco Use  ? Smoking status: Never  ? Smokeless tobacco: Never  ?Substance and Sexual Activity  ? Alcohol use: Yes  ?  Comment: occasionally  ? Drug use: Never  ? Sexual activity: Not on file  ?Other Topics Concern  ? Not on file  ?Social History Narrative  ? Not on file  ? ?Social Determinants of Health  ? ?Financial Resource Strain: Not on file  ?Food Insecurity: Not on file  ?Transportation Needs: Not on file  ?Physical Activity: Not on file  ?Stress: Not on file  ?Social Connections: Not on file  ?Intimate Partner Violence: Not on file  ? ? ?FAMILY HISTORY: ?No family history on file. ? ?ALLERGIES:  has no allergies on file. ? ?MEDICATIONS:  ?Current Outpatient Medications  ?Medication Sig Dispense Refill  ? ferrous sulfate 325 (65 FE) MG EC tablet Take 1 tablet (325 mg total) by mouth daily with breakfast. 90 tablet 2  ? ?No current facility-administered medications for this visit.  ? ? ?REVIEW OF SYSTEMS:   ?Constitutional: ( - ) fevers, ( - )  chills , ( - ) night sweats ?Eyes: ( - ) blurriness of vision, ( - ) double vision, ( - ) watery eyes ?Ears, nose, mouth, throat, and face: ( - ) mucositis, ( - ) sore throat ?Respiratory: ( - ) cough, ( - ) dyspnea, ( - ) wheezes ?Cardiovascular: ( - ) palpitation, ( - ) chest discomfort, ( - ) lower extremity swelling ?Gastrointestinal:  ( - ) nausea, ( - ) heartburn, ( - ) change in bowel habits ?Skin: ( - ) abnormal skin rashes ?Lymphatics: ( - ) new lymphadenopathy, ( - ) easy bruising ?Neurological: ( - ) numbness, ( - ) tingling, ( - ) new weaknesses ?Behavioral/Psych: ( - ) mood change, ( - ) new changes  ?All other systems were  reviewed with the patient and are negative. ? ?PHYSICAL EXAMINATION: ? ?Vitals:  ? 04/09/21 1532  ?BP: 103/72  ?Pulse: 79  ?Resp: 17  ?Temp: 97.7 ?F (36.5 ?C)  ?SpO2: 97%  ? ?Filed Weights  ? 04/09/21 1532  ?Weight: 161 lb 3.2 oz (73.1 kg)  ? ? ?GENERAL: Well-appearing middle-aged Hispanic female, alert, no distress and comfortable ?SKIN: skin color, texture, turgor are normal, no rashes or significant lesions ?LUNGS: clear to auscultation and percussion with normal breathing effort ?HEART: regular rate & rhythm and no murmurs and no lower extremity edema ?Musculoskeletal: no cyanosis of digits and no clubbing  ?PSYCH: alert & oriented x 3, fluent speech ?NEURO: no focal motor/sensory deficits ? ?LABORATORY DATA:  ?I have reviewed the data as listed ?CBC Latest Ref Rng & Units 04/09/2021 01/09/2021 10/08/2020  ?WBC 4.0 - 10.5 K/uL 6.3 5.5 8.9  ?Hemoglobin 12.0 - 15.0 g/dL 72.5 11.2(L) 11.2(L)  ?Hematocrit 36.0 - 46.0 % 38.7 35.4(L) 36.0  ?Platelets 150 - 400 K/uL 281 307 310  ? ? ?CMP Latest Ref Rng & Units 04/09/2021 01/09/2021 10/08/2020  ?Glucose 70 - 99 mg/dL 366(Y) 403(K) 742(V)  ?BUN 6 - 20 mg/dL 12 11 10   ?Creatinine 0.44 - 1.00 mg/dL 9.56 3.87  ?Sodium 135 - 145 mmol/L 139 140 140  ?Potassium 3.5 - 5.1 mmol/L 3.8 4.0 4.0  ?Chloride 98 - 111 mmol/L 102 108 104  ?CO2 22 - 32 mmol/L 30 24 26   ?Calcium 8.9 - 10.3 mg/dL 9.1 5.64) 9.2  ?Total Protein 6.5 - 8.1 g/dL 7.8 7.4 7.9  ?Total Bilirubin 0.3 - 1.2 mg/dL 0.3 0.4 0.3  ?Alkaline Phos 38 - 126 U/L 46 55 53  ?AST 15 - 41 U/L 18 11(L) 11(L)  ?ALT 0 - 44 U/L 12 8 7   ? ? ?No results found for: MPROTEIN ?No results found for: KPAFRELGTCHN, LAMBDASER, KAPLAMBRATIO ? ?RADIOGRAPHIC STUDIES: ?No results found. ? ?ASSESSMENT & PLAN ?Danielle Howell 51 y.o. female with medical history significant for iron deficiency anemia of unclear etiology who presents for a follow up visit. ? ?Possible etiologies for this patient's iron deficiency anemia include menstrual bleeding, GI  bleeding, or possible malabsorption.  Her hemoglobin is relatively stable at 11.2, unchanged from her prior visit.  Patient notes that she would not like to pursue IV iron therapy and therefore we can continue p.o. therapy at this time with a repeat visit in 6 months in order to assure her hemoglobin levels are stable/improving. ? ?# Iron Deficiency Anemia, Unclear Etiology  ?--Continue ferrous sulfate 325 mg once daily. Advised to take with a source of vitamin C ?--Differentials include menstrual bleeding, GI bleeding and malabsorption. ?-- Patient reports that gastroenterology is reach out  to her but she has not called them back.  Encouraged her to reconnect with gastroenterology for consideration of colonoscopy. ?--Labs today to check CBC, CMP, iron and TIBC, retic panel and ferritin levels.  ?-- Labs today show white blood cell count 6.3, hemoglobin 12.8, MCV 85.8, and platelets 281.  Iron labs pending ?-- Patient notes that she would like to continue p.o. iron therapy rather than pursuing IV iron.  Hemoglobin has normalized, unlikely she will need IV iron therapy. ?--RTC in 6 months with labs. ? ?No orders of the defined types were placed in this encounter. ? ?All questions were answered. The patient knows to call the clinic with any problems, questions or concerns. ? ?A total of more than 30 minutes were spent on this encounter with face-to-face time and non-face-to-face time, including preparing to see the patient, ordering tests and/or medications, counseling the patient and coordination of care as outlined above.  ? ?Ulysees BarnsJohn T. Gaines Cartmell, MD ?Department of Hematology/Oncology ?[facility] ?Phone: [phone] ?Pager: [phone] ?Email: [email or web address] ? ?04/09/2021 4:07 PM ? ?

## 2021-07-25 ENCOUNTER — Other Ambulatory Visit: Payer: Self-pay | Admitting: Family Medicine

## 2021-08-19 ENCOUNTER — Other Ambulatory Visit: Payer: Self-pay | Admitting: Family Medicine

## 2021-08-19 DIAGNOSIS — Z1231 Encounter for screening mammogram for malignant neoplasm of breast: Secondary | ICD-10-CM

## 2021-08-25 ENCOUNTER — Ambulatory Visit: Payer: 59

## 2021-09-08 ENCOUNTER — Ambulatory Visit
Admission: RE | Admit: 2021-09-08 | Discharge: 2021-09-08 | Disposition: A | Discharge status: Home / Self Care | Payer: 59 | Source: Ambulatory Visit | Attending: Family Medicine | Admitting: Family Medicine

## 2021-09-08 DIAGNOSIS — Z1231 Encounter for screening mammogram for malignant neoplasm of breast: Secondary | ICD-10-CM

## 2021-10-10 ENCOUNTER — Inpatient Hospital Stay (HOSPITAL_BASED_OUTPATIENT_CLINIC_OR_DEPARTMENT_OTHER): Payer: 59 | Admitting: Hematology and Oncology

## 2021-10-10 ENCOUNTER — Other Ambulatory Visit: Payer: Self-pay

## 2021-10-10 ENCOUNTER — Inpatient Hospital Stay: Payer: 59 | Attending: Hematology and Oncology

## 2021-10-10 ENCOUNTER — Other Ambulatory Visit: Payer: Self-pay | Admitting: Hematology and Oncology

## 2021-10-10 VITALS — BP 112/71 | HR 83 | Temp 98.0°F | Resp 15 | Wt 154.1 lb

## 2021-10-10 DIAGNOSIS — D508 Other iron deficiency anemias: Secondary | ICD-10-CM | POA: Diagnosis not present

## 2021-10-10 DIAGNOSIS — D509 Iron deficiency anemia, unspecified: Secondary | ICD-10-CM | POA: Diagnosis present

## 2021-10-10 LAB — CMP (CANCER CENTER ONLY)
ALT: 8 U/L (ref 0–44)
AST: 14 U/L — ABNORMAL LOW (ref 15–41)
Albumin: 4.2 g/dL (ref 3.5–5.0)
Alkaline Phosphatase: 46 U/L (ref 38–126)
Anion gap: 5 (ref 5–15)
BUN: 12 mg/dL (ref 6–20)
CO2: 30 mmol/L (ref 22–32)
Calcium: 9.4 mg/dL (ref 8.9–10.3)
Chloride: 105 mmol/L (ref 98–111)
Creatinine: 0.71 mg/dL (ref 0.44–1.00)
GFR, Estimated: >60 mL/min (ref >60)
Glucose, Bld: 175 mg/dL — ABNORMAL HIGH (ref 70–99)
Potassium: 3.8 mmol/L (ref 3.5–5.1)
Sodium: 140 mmol/L (ref 135–145)
Total Bilirubin: 0.4 mg/dL (ref 0.3–1.2)
Total Protein: 7.5 g/dL (ref 6.5–8.1)

## 2021-10-10 LAB — RETIC PANEL
Immature Retic Fract: 10.1 % (ref 2.3–15.9)
RBC.: 4.16 MIL/uL (ref 3.87–5.11)
Retic Count, Absolute: 64.1 10*3/uL (ref 19.0–186.0)
Retic Ct Pct: 1.5 % (ref 0.4–3.1)
Reticulocyte Hemoglobin: 29 pg (ref >27.9)

## 2021-10-10 LAB — CBC WITH DIFFERENTIAL (CANCER CENTER ONLY)
Abs Immature Granulocytes: 0.01 10*3/uL (ref 0.00–0.07)
Basophils Absolute: 0 10*3/uL (ref 0.0–0.1)
Basophils Relative: 1 %
Eosinophils Absolute: 0.1 10*3/uL (ref 0.0–0.5)
Eosinophils Relative: 1 %
HCT: 37.3 % (ref 36.0–46.0)
Hemoglobin: 12.2 g/dL (ref 12.0–15.0)
Immature Granulocytes: 0 %
Lymphocytes Relative: 42 %
Lymphs Abs: 2.1 10*3/uL (ref 0.7–4.0)
MCH: 29.4 pg (ref 26.0–34.0)
MCHC: 32.7 g/dL (ref 30.0–36.0)
MCV: 89.9 fL (ref 80.0–100.0)
Monocytes Absolute: 0.2 10*3/uL (ref 0.1–1.0)
Monocytes Relative: 3 %
Neutro Abs: 2.7 10*3/uL (ref 1.7–7.7)
Neutrophils Relative %: 53 %
Platelet Count: 308 10*3/uL (ref 150–400)
RBC: 4.15 MIL/uL (ref 3.87–5.11)
RDW: 12.2 % (ref 11.5–15.5)
WBC Count: 5.1 10*3/uL (ref 4.0–10.5)
nRBC: 0 % (ref 0.0–0.2)

## 2021-10-10 LAB — IRON AND IRON BINDING CAPACITY (CC-WL,HP ONLY)
Iron: 45 ug/dL (ref 28–170)
Saturation Ratios: 12 % (ref 10.4–31.8)
TIBC: 384 ug/dL (ref 250–450)
UIBC: 339 ug/dL (ref 148–442)

## 2021-10-10 LAB — FERRITIN: Ferritin: 8 ng/mL — ABNORMAL LOW (ref 11–307)

## 2021-10-10 NOTE — Progress Notes (Signed)
[facility] Telephone:(336) [phone]   [phone]  PROGRESS NOTE  Patient Care Team: Shon Hale, MD as PCP - General (Family Medicine)  Hematological/Oncological History # Iron Deficiency Anemia, Unclear Etiology  1) Labs from PCP, Dr. Garth Bigness, at [facility].  -08/07/2020: WBC 6.2, Hgb 8.0 (L), MCV 64.8 (L), Plt 362 -08/16/2020: TIBC 459 (H), Iron 13 (L), Iron saturation 3% (L), Transferrin 328 -09/13/2020: WBC 5.1, Hgb 9.4 (L), MCV 72.5 (L), Plt 363, TIBC 42, Iron 35 (L), Iron saturation 8% (L), Transferrin 303.    2) 10/08/2020: Establish care with Danielle Kaufmann PA-C  04/09/2021: Labs show white blood cell count 6.3, hemoglobin 12.8, MCV 85.8, and platelets of 281.  Interval History:  Danielle Howell 51 y.o. female with medical history significant for iron deficiency anemia of unclear etiology who presents for a follow up visit. The patient's last visit was on 04/09/2021. In the interim since the last visit she is continued on p.o. ferrous sulfate 325 mg daily  On exam today Danielle Howell reports her energy is about a 7 out of 10.  She reports she is not having any shortness of breath, lightheadedness, or dizziness.  She reports that she is under evaluation by gastroenterology at [facility].  She reports that she underwent a camera capsule and during evaluation was also noted to have 2 polyps which were subsequently removed.  She reports that she continues to take her iron pills not causing any stomach upset or constipation.  She denies any overt signs of bleeding.  Her weight has dropped from 163 pounds down to 154 pounds due to intentional weight loss.     She currently denies any fevers, chills, sweats, nausea, vomiting or diarrhea.  She denies any ice cravings or fatigue/shortness of breath.  A full 10 point ROS is listed below.  MEDICAL HISTORY:  No past medical history on file.  SURGICAL HISTORY: Past Surgical History:  Procedure Laterality Date    ABDOMINOPLASTY  2015    SOCIAL HISTORY: Social History   Socioeconomic History   Marital status: Single    Spouse name: Not on file   Number of children: Not on file   Years of education: Not on file   Highest education level: Not on file  Occupational History   Not on file  Tobacco Use   Smoking status: Never   Smokeless tobacco: Never  Substance and Sexual Activity   Alcohol use: Yes    Comment: occasionally   Drug use: Never   Sexual activity: Not on file  Other Topics Concern   Not on file  Social History Narrative   Not on file   Social Determinants of Health   Financial Resource Strain: Not on file  Food Insecurity: Not on file  Transportation Needs: Not on file  Physical Activity: Not on file  Stress: Not on file  Social Connections: Not on file  Intimate Partner Violence: Not on file    FAMILY HISTORY: No family history on file.  ALLERGIES:  has no allergies on file.  MEDICATIONS:  Current Outpatient Medications  Medication Sig Dispense Refill   ferrous sulfate 325 (65 FE) MG EC tablet Take 1 tablet (325 mg total) by mouth daily with breakfast. 90 tablet 2   No current facility-administered medications for this visit.    REVIEW OF SYSTEMS:   Constitutional: ( - ) fevers, ( - )  chills , ( - ) night sweats Eyes: ( - ) blurriness of vision, ( - )  double vision, ( - ) watery eyes Ears, nose, mouth, throat, and face: ( - ) mucositis, ( - ) sore throat Respiratory: ( - ) cough, ( - ) dyspnea, ( - ) wheezes Cardiovascular: ( - ) palpitation, ( - ) chest discomfort, ( - ) lower extremity swelling Gastrointestinal:  ( - ) nausea, ( - ) heartburn, ( - ) change in bowel habits Skin: ( - ) abnormal skin rashes Lymphatics: ( - ) new lymphadenopathy, ( - ) easy bruising Neurological: ( - ) numbness, ( - ) tingling, ( - ) new weaknesses Behavioral/Psych: ( - ) mood change, ( - ) new changes  All other systems were reviewed with the patient and are  negative.  PHYSICAL EXAMINATION:  Vitals:   10/10/21 1149  BP: 112/71  Pulse: 83  Resp: 15  Temp: 98 F (36.7 C)  SpO2: 100%   Filed Weights   10/10/21 1149  Weight: 154 lb 1.6 oz (69.9 kg)    GENERAL: Well-appearing middle-aged Hispanic female, alert, no distress and comfortable SKIN: skin color, texture, turgor are normal, no rashes or significant lesions LUNGS: clear to auscultation and percussion with normal breathing effort HEART: regular rate & rhythm and no murmurs and no lower extremity edema Musculoskeletal: no cyanosis of digits and no clubbing  PSYCH: alert & oriented x 3, fluent speech NEURO: no focal motor/sensory deficits  LABORATORY DATA:  I have reviewed the data as listed    Latest Ref Rng & Units 10/10/2021   11:07 AM 04/09/2021    2:23 PM 01/09/2021    9:58 AM  CBC  WBC 4.0 - 10.5 K/uL 5.1  6.3  5.5   Hemoglobin 12.0 - 15.0 g/dL 12.2  12.8  11.2   Hematocrit 36.0 - 46.0 % 37.3  38.7  35.4   Platelets 150 - 400 K/uL 308  281  307        Latest Ref Rng & Units 10/10/2021   11:07 AM 04/09/2021    2:23 PM 01/09/2021    9:58 AM  CMP  Glucose 70 - 99 mg/dL 175  143  207   BUN 6 - 20 mg/dL 12  12  11    Creatinine 0.44 - 1.00 mg/dL 0.71  0.64  0.76   Sodium 135 - 145 mmol/L 140  139  140   Potassium 3.5 - 5.1 mmol/L 3.8  3.8  4.0   Chloride 98 - 111 mmol/L 105  102  108   CO2 22 - 32 mmol/L 30  30  24    Calcium 8.9 - 10.3 mg/dL 9.4  9.1  8.3   Total Protein 6.5 - 8.1 g/dL 7.5  7.8  7.4   Total Bilirubin 0.3 - 1.2 mg/dL 0.4  0.3  0.4   Alkaline Phos 38 - 126 U/L 46  46  55   AST 15 - 41 U/L 14  18  11    ALT 0 - 44 U/L 8  12  8      No results found for: "MPROTEIN" No results found for: "KPAFRELGTCHN", "LAMBDASER", "KAPLAMBRATIO"  RADIOGRAPHIC STUDIES: No results found.  ASSESSMENT & PLAN Danielle Howell 51 y.o. female with medical history significant for iron deficiency anemia of unclear etiology who presents for a follow up visit.  Possible etiologies  for this patient's iron deficiency anemia include menstrual bleeding, GI bleeding, or possible malabsorption.  Her hemoglobin is relatively stable at 12.2, unchanged from her prior visit.  Patient notes that she would not like to  pursue IV iron therapy and therefore we can continue p.o. therapy at this time with a repeat visit in 6 months in order to assure her hemoglobin levels are stable/improving.  # Iron Deficiency Anemia, Unclear Etiology  --Continue ferrous sulfate 325 mg once daily. Advised to take with a source of vitamin C --Differentials include menstrual bleeding, GI bleeding and malabsorption. -- Patient reports that gastroenterology is reach out to her but she has not called them back.  Encouraged her to reconnect with gastroenterology for consideration of colonoscopy. --Labs today to check CBC, CMP, iron and TIBC, retic panel and ferritin levels.  -- Labs today show white blood cell count  5.1, hemoglobin 12.2, MCV 89.9, and platelets of 308. -- Patient notes that she would like to continue p.o. iron therapy rather than pursuing IV iron.  Hemoglobin has normalized, unlikely she will need IV iron therapy. --RTC in 6 months with labs.  No orders of the defined types were placed in this encounter.  All questions were answered. The patient knows to call the clinic with any problems, questions or concerns.  A total of more than 30 minutes were spent on this encounter with face-to-face time and non-face-to-face time, including preparing to see the patient, ordering tests and/or medications, counseling the patient and coordination of care as outlined above.   Ulysees Barns, MD Department of Hematology/Oncology [facility] Phone: [phone] Pager: [phone] Email: [email or web address]  10/11/2021 5:50 PM

## 2021-10-13 ENCOUNTER — Telehealth: Payer: Self-pay | Admitting: Hematology and Oncology

## 2021-10-13 NOTE — Telephone Encounter (Signed)
Pe 9/8 los called and spoke to pt about appointment

## 2022-01-09 ENCOUNTER — Other Ambulatory Visit: Payer: Self-pay | Admitting: Hematology and Oncology

## 2022-02-12 ENCOUNTER — Telehealth: Payer: Self-pay | Admitting: Hematology and Oncology

## 2022-02-12 NOTE — Telephone Encounter (Signed)
Called patient to confirm reschedule appointment from 3/8 due to provider PAL. Patient notified and rescheduled.

## 2022-04-10 ENCOUNTER — Ambulatory Visit: Payer: 59 | Admitting: Hematology and Oncology

## 2022-04-10 ENCOUNTER — Other Ambulatory Visit: Payer: 59

## 2022-04-23 ENCOUNTER — Telehealth: Payer: Self-pay | Admitting: Hematology and Oncology

## 2022-04-23 NOTE — Telephone Encounter (Signed)
Per 3/21 IB reached out to patient to reschedule; left voicemail

## 2022-04-24 ENCOUNTER — Inpatient Hospital Stay: Payer: 59

## 2022-04-24 ENCOUNTER — Inpatient Hospital Stay: Payer: 59 | Admitting: Hematology and Oncology

## 2022-04-30 ENCOUNTER — Inpatient Hospital Stay (HOSPITAL_BASED_OUTPATIENT_CLINIC_OR_DEPARTMENT_OTHER): Payer: 59 | Admitting: Hematology and Oncology

## 2022-04-30 ENCOUNTER — Other Ambulatory Visit: Payer: Self-pay | Admitting: Hematology and Oncology

## 2022-04-30 ENCOUNTER — Inpatient Hospital Stay: Payer: 59 | Attending: Hematology and Oncology

## 2022-04-30 ENCOUNTER — Other Ambulatory Visit: Payer: Self-pay

## 2022-04-30 VITALS — BP 116/83 | HR 77 | Temp 97.5°F | Resp 20 | Wt 151.2 lb

## 2022-04-30 DIAGNOSIS — D508 Other iron deficiency anemias: Secondary | ICD-10-CM | POA: Diagnosis not present

## 2022-04-30 DIAGNOSIS — D509 Iron deficiency anemia, unspecified: Secondary | ICD-10-CM | POA: Diagnosis present

## 2022-04-30 LAB — CBC WITH DIFFERENTIAL (CANCER CENTER ONLY)
Abs Immature Granulocytes: 0.01 10*3/uL (ref 0.00–0.07)
Basophils Absolute: 0 10*3/uL (ref 0.0–0.1)
Basophils Relative: 0 %
Eosinophils Absolute: 0.1 10*3/uL (ref 0.0–0.5)
Eosinophils Relative: 2 %
HCT: 41.4 % (ref 36.0–46.0)
Hemoglobin: 13.4 g/dL (ref 12.0–15.0)
Immature Granulocytes: 0 %
Lymphocytes Relative: 35 %
Lymphs Abs: 1.6 10*3/uL (ref 0.7–4.0)
MCH: 28.8 pg (ref 26.0–34.0)
MCHC: 32.4 g/dL (ref 30.0–36.0)
MCV: 89 fL (ref 80.0–100.0)
Monocytes Absolute: 0.1 10*3/uL (ref 0.1–1.0)
Monocytes Relative: 3 %
Neutro Abs: 2.7 10*3/uL (ref 1.7–7.7)
Neutrophils Relative %: 60 %
Platelet Count: 232 10*3/uL (ref 150–400)
RBC: 4.65 MIL/uL (ref 3.87–5.11)
RDW: 12.7 % (ref 11.5–15.5)
WBC Count: 4.5 10*3/uL (ref 4.0–10.5)
nRBC: 0 % (ref 0.0–0.2)

## 2022-04-30 LAB — CMP (CANCER CENTER ONLY)
ALT: 8 U/L (ref 0–44)
AST: 13 U/L — ABNORMAL LOW (ref 15–41)
Albumin: 4.2 g/dL (ref 3.5–5.0)
Alkaline Phosphatase: 41 U/L (ref 38–126)
Anion gap: 5 (ref 5–15)
BUN: 15 mg/dL (ref 6–20)
CO2: 29 mmol/L (ref 22–32)
Calcium: 9.1 mg/dL (ref 8.9–10.3)
Chloride: 105 mmol/L (ref 98–111)
Creatinine: 0.67 mg/dL (ref 0.44–1.00)
GFR, Estimated: >60 mL/min (ref >60)
Glucose, Bld: 211 mg/dL — ABNORMAL HIGH (ref 70–99)
Potassium: 4.1 mmol/L (ref 3.5–5.1)
Sodium: 139 mmol/L (ref 135–145)
Total Bilirubin: 0.4 mg/dL (ref 0.3–1.2)
Total Protein: 7.8 g/dL (ref 6.5–8.1)

## 2022-04-30 LAB — IRON AND IRON BINDING CAPACITY (CC-WL,HP ONLY)
Iron: 89 ug/dL (ref 28–170)
Saturation Ratios: 24 % (ref 10.4–31.8)
TIBC: 368 ug/dL (ref 250–450)
UIBC: 279 ug/dL (ref 148–442)

## 2022-04-30 LAB — RETIC PANEL
Immature Retic Fract: 7.9 % (ref 2.3–15.9)
RBC.: 4.66 MIL/uL (ref 3.87–5.11)
Retic Count, Absolute: 54.1 10*3/uL (ref 19.0–186.0)
Retic Ct Pct: 1.2 % (ref 0.4–3.1)
Reticulocyte Hemoglobin: 34.1 pg (ref >27.9)

## 2022-04-30 LAB — FERRITIN: Ferritin: 20 ng/mL (ref 11–307)

## 2022-04-30 NOTE — Progress Notes (Signed)
[facility] [phone]) [phone]   Fax:(336) [phone]  PROGRESS NOTE  Patient Care Team: Glenis Smoker, MD as PCP - General (Family Medicine)  Hematological/Oncological History # Iron Deficiency Anemia, Unclear Etiology  1) Labs from PCP, Dr. Sela Hilding, at [facility].  -08/07/2020: WBC 6.2, Hgb 8.0 (L), MCV 64.8 (L), Plt 362 -08/16/2020: TIBC 459 (H), Iron 13 (L), Iron saturation 3% (L), Transferrin 328 -09/13/2020: WBC 5.1, Hgb 9.4 (L), MCV 72.5 (L), Plt 363, TIBC 42, Iron 35 (L), Iron saturation 8% (L), Transferrin 303.    2) 10/08/2020: Establish care with Dede Query PA-C  04/09/2021: Labs show white blood cell count 6.3, hemoglobin 12.8, MCV 85.8, and platelets of 281.  04/30/2022: WBC 4.5, Hgb 13.4, MCV 89, Plt 232  Interval History:  Danielle Howell 52 y.o. female with medical history significant for iron deficiency anemia of unclear etiology who presents for a follow up visit. The patient's last visit was on 10/10/2021. In the interim since the last visit she is continued on p.o. ferrous sulfate 325 mg daily  On exam today Danielle Howell reports she has been doing well overall in the interim since her last visit.  She reports her energy is currently a 9 out of 10.  She continues to take her iron pills daily and does not cause any stomach upset, diarrhea, or constipation.  She reports that she is eating well and trying to eat red meat at least once per week.  She does enjoy eating spinach and broccoli.  She notes that she has not had any bleeding such as nosebleeding or gum bleeding.  She has had no emergency department visits or hospital stays.  She does that she is been doing her best to try to walk more outside.  Overall she is at her baseline level of health with no questions concerns or complaints today. She currently denies any fevers, chills, sweats, nausea, vomiting or diarrhea.  She denies any ice cravings or fatigue/shortness of breath.  A full 10 point  ROS is listed below.  MEDICAL HISTORY:  No past medical history on file.  SURGICAL HISTORY: Past Surgical History:  Procedure Laterality Date   ABDOMINOPLASTY  2015    SOCIAL HISTORY: Social History   Socioeconomic History   Marital status: Single    Spouse name: Not on file   Number of children: Not on file   Years of education: Not on file   Highest education level: Not on file  Occupational History   Not on file  Tobacco Use   Smoking status: Never   Smokeless tobacco: Never  Substance and Sexual Activity   Alcohol use: Yes    Comment: occasionally   Drug use: Never   Sexual activity: Not on file  Other Topics Concern   Not on file  Social History Narrative   Not on file   Social Determinants of Health   Financial Resource Strain: Not on file  Food Insecurity: Not on file  Transportation Needs: Not on file  Physical Activity: Not on file  Stress: Not on file  Social Connections: Not on file  Intimate Partner Violence: Not on file    FAMILY HISTORY: No family history on file.  ALLERGIES:  has no allergies on file.  MEDICATIONS:  Current Outpatient Medications  Medication Sig Dispense Refill   ferrous sulfate 325 (65 FE) MG EC tablet TAKE 1 TABLET BY MOUTH EVERY DAY WITH BREAKFAST 90 tablet 2   No current facility-administered medications for this  visit.    REVIEW OF SYSTEMS:   Constitutional: ( - ) fevers, ( - )  chills , ( - ) night sweats Eyes: ( - ) blurriness of vision, ( - ) double vision, ( - ) watery eyes Ears, nose, mouth, throat, and face: ( - ) mucositis, ( - ) sore throat Respiratory: ( - ) cough, ( - ) dyspnea, ( - ) wheezes Cardiovascular: ( - ) palpitation, ( - ) chest discomfort, ( - ) lower extremity swelling Gastrointestinal:  ( - ) nausea, ( - ) heartburn, ( - ) change in bowel habits Skin: ( - ) abnormal skin rashes Lymphatics: ( - ) new lymphadenopathy, ( - ) easy bruising Neurological: ( - ) numbness, ( - ) tingling, ( - ) new  weaknesses Behavioral/Psych: ( - ) mood change, ( - ) new changes  All other systems were reviewed with the patient and are negative.  PHYSICAL EXAMINATION:  Vitals:   04/30/22 1054  BP: 116/83  Pulse: 77  Resp: 20  Temp: (!) 97.5 F (36.4 C)  SpO2: 100%    Filed Weights   04/30/22 1054  Weight: 151 lb 3.2 oz (68.6 kg)     GENERAL: Well-appearing middle-aged Hispanic female, alert, no distress and comfortable SKIN: skin color, texture, turgor are normal, no rashes or significant lesions LUNGS: clear to auscultation and percussion with normal breathing effort HEART: regular rate & rhythm and no murmurs and no lower extremity edema Musculoskeletal: no cyanosis of digits and no clubbing  PSYCH: alert & oriented x 3, fluent speech NEURO: no focal motor/sensory deficits  LABORATORY DATA:  I have reviewed the data as listed    Latest Ref Rng & Units 04/30/2022   10:28 AM 10/10/2021   11:07 AM 04/09/2021    2:23 PM  CBC  WBC 4.0 - 10.5 K/uL 4.5  5.1  6.3   Hemoglobin 12.0 - 15.0 g/dL 13.4  12.2  12.8   Hematocrit 36.0 - 46.0 % 41.4  37.3  38.7   Platelets 150 - 400 K/uL 232  308  281        Latest Ref Rng & Units 04/30/2022   10:28 AM 10/10/2021   11:07 AM 04/09/2021    2:23 PM  CMP  Glucose 70 - 99 mg/dL 211  175  143   BUN 6 - 20 mg/dL 15  12  12    Creatinine 0.44 - 1.00 mg/dL 0.67  0.71  0.64   Sodium 135 - 145 mmol/L 139  140  139   Potassium 3.5 - 5.1 mmol/L 4.1  3.8  3.8   Chloride 98 - 111 mmol/L 105  105  102   CO2 22 - 32 mmol/L 29  30  30    Calcium 8.9 - 10.3 mg/dL 9.1  9.4  9.1   Total Protein 6.5 - 8.1 g/dL 7.8  7.5  7.8   Total Bilirubin 0.3 - 1.2 mg/dL 0.4  0.4  0.3   Alkaline Phos 38 - 126 U/L 41  46  46   AST 15 - 41 U/L 13  14  18    ALT 0 - 44 U/L 8  8  12      No results found for: "MPROTEIN" No results found for: "KPAFRELGTCHN", "LAMBDASER", "KAPLAMBRATIO"  RADIOGRAPHIC STUDIES: No results found.  ASSESSMENT & PLAN Danielle Howell 52 y.o. female  with medical history significant for iron deficiency anemia of unclear etiology who presents for a follow up visit.  Possible etiologies  for this patient's iron deficiency anemia include menstrual bleeding, GI bleeding, or possible malabsorption.  Her hemoglobin is relatively stable at 12.2, unchanged from her prior visit.  Patient notes that she would not like to pursue IV iron therapy and therefore we can continue p.o. therapy at this time with a repeat visit in 6 months in order to assure her hemoglobin levels are stable/improving.  # Iron Deficiency Anemia, Unclear Etiology  --Continue ferrous sulfate 325 mg once daily. Advised to take with a source of vitamin C --Differentials include menstrual bleeding, GI bleeding and malabsorption. -- Patient reports that gastroenterology is reach out to her but she has not called them back.  Encouraged her to reconnect with gastroenterology for consideration of colonoscopy. --Labs today to check CBC, CMP, iron and TIBC, retic panel and ferritin levels.  -- Labs today show white blood cell count  4.5, Hgb 13.4, MCV 89, Plt 232 -- Patient notes that she would like to continue p.o. iron therapy rather than pursuing IV iron.  Hemoglobin has normalized, unlikely she will need IV iron therapy moving forward.  --RTC PRN  No orders of the defined types were placed in this encounter.  All questions were answered. The patient knows to call the clinic with any problems, questions or concerns.  A total of more than 25 minutes were spent on this encounter with face-to-face time and non-face-to-face time, including preparing to see the patient, ordering tests and/or medications, counseling the patient and coordination of care as outlined above.   Ledell Peoples, MD Department of Hematology/Oncology [place] [facility] Phone: [phone] Pager: [phone] Email: [email or web address]  04/30/2022 3:54 PM

## 2022-11-09 ENCOUNTER — Other Ambulatory Visit: Payer: Self-pay | Admitting: Family Medicine

## 2022-11-09 DIAGNOSIS — Z1231 Encounter for screening mammogram for malignant neoplasm of breast: Secondary | ICD-10-CM

## 2022-11-10 ENCOUNTER — Other Ambulatory Visit: Payer: Self-pay | Admitting: Internal Medicine

## 2022-11-10 DIAGNOSIS — N644 Mastodynia: Secondary | ICD-10-CM

## 2022-11-25 ENCOUNTER — Ambulatory Visit
Admission: RE | Admit: 2022-11-25 | Discharge: 2022-11-25 | Disposition: A | Discharge status: Home / Self Care | Payer: 59 | Source: Ambulatory Visit | Attending: Internal Medicine | Admitting: Internal Medicine

## 2022-11-25 ENCOUNTER — Ambulatory Visit: Payer: 59

## 2022-11-25 DIAGNOSIS — N644 Mastodynia: Secondary | ICD-10-CM

## 2023-03-08 ENCOUNTER — Other Ambulatory Visit: Payer: Self-pay | Admitting: Obstetrics and Gynecology

## 2023-03-08 ENCOUNTER — Other Ambulatory Visit (HOSPITAL_COMMUNITY)
Admission: RE | Admit: 2023-03-08 | Discharge: 2023-03-08 | Disposition: A | Discharge status: Home / Self Care | Payer: 59 | Source: Ambulatory Visit | Attending: Obstetrics and Gynecology | Admitting: Obstetrics and Gynecology

## 2023-03-08 DIAGNOSIS — Z01419 Encounter for gynecological examination (general) (routine) without abnormal findings: Secondary | ICD-10-CM | POA: Diagnosis present

## 2023-03-11 LAB — CYTOLOGY - PAP
Comment: NEGATIVE
Diagnosis: NEGATIVE
High risk HPV: NEGATIVE

## 2024-01-06 ENCOUNTER — Other Ambulatory Visit: Payer: Self-pay | Admitting: Internal Medicine

## 2024-01-06 DIAGNOSIS — Z1231 Encounter for screening mammogram for malignant neoplasm of breast: Secondary | ICD-10-CM

## 2024-02-02 ENCOUNTER — Ambulatory Visit

## 2024-02-16 ENCOUNTER — Ambulatory Visit
Admission: RE | Admit: 2024-02-16 | Discharge: 2024-02-16 | Disposition: A | Discharge status: Home / Self Care | Source: Ambulatory Visit | Attending: Internal Medicine | Admitting: Internal Medicine

## 2024-02-16 DIAGNOSIS — Z1231 Encounter for screening mammogram for malignant neoplasm of breast: Secondary | ICD-10-CM
